# Patient Record
Sex: Male | Born: 1980 | Race: White | Hispanic: No | Marital: Single | Smoking: Current every day smoker
Health system: Southern US, Community
[De-identification: ages and names within clinical notes are randomized; demographics above are authoritative.]

---

## 2014-04-10 ENCOUNTER — Encounter (HOSPITAL_COMMUNITY): Payer: Self-pay | Admitting: Emergency Medicine

## 2014-04-10 ENCOUNTER — Emergency Department (HOSPITAL_COMMUNITY)
Admission: EM | Admit: 2014-04-10 | Discharge: 2014-04-10 | Disposition: A | Discharge status: Home / Self Care | Payer: BLUE CROSS/BLUE SHIELD | Attending: Emergency Medicine | Admitting: Emergency Medicine

## 2014-04-10 ENCOUNTER — Emergency Department (HOSPITAL_COMMUNITY): Payer: BLUE CROSS/BLUE SHIELD

## 2014-04-10 DIAGNOSIS — R109 Unspecified abdominal pain: Secondary | ICD-10-CM | POA: Diagnosis not present

## 2014-04-10 DIAGNOSIS — Z720 Tobacco use: Secondary | ICD-10-CM | POA: Insufficient documentation

## 2014-04-10 DIAGNOSIS — R0602 Shortness of breath: Secondary | ICD-10-CM | POA: Diagnosis not present

## 2014-04-10 DIAGNOSIS — M79602 Pain in left arm: Secondary | ICD-10-CM | POA: Insufficient documentation

## 2014-04-10 DIAGNOSIS — R079 Chest pain, unspecified: Secondary | ICD-10-CM | POA: Diagnosis not present

## 2014-04-10 LAB — CBC
HCT: 43.1 % (ref 39.0–52.0)
Hemoglobin: 14.7 g/dL (ref 13.0–17.0)
MCH: 32.6 pg (ref 26.0–34.0)
MCHC: 34.1 g/dL (ref 30.0–36.0)
MCV: 95.6 fL (ref 78.0–100.0)
PLATELETS: 151 10*3/uL (ref 150–400)
RBC: 4.51 MIL/uL (ref 4.22–5.81)
RDW: 12.4 % (ref 11.5–15.5)
WBC: 7.3 10*3/uL (ref 4.0–10.5)

## 2014-04-10 LAB — URINALYSIS, ROUTINE W REFLEX MICROSCOPIC
Bilirubin Urine: NEGATIVE
GLUCOSE, UA: NEGATIVE mg/dL
Hgb urine dipstick: NEGATIVE
Ketones, ur: NEGATIVE mg/dL
LEUKOCYTES UA: NEGATIVE
NITRITE: NEGATIVE
PH: 6.5 (ref 5.0–8.0)
Protein, ur: NEGATIVE mg/dL
Specific Gravity, Urine: 1.019 (ref 1.005–1.030)
Urobilinogen, UA: 0.2 mg/dL (ref 0.0–1.0)

## 2014-04-10 LAB — BASIC METABOLIC PANEL
ANION GAP: 6 (ref 5–15)
BUN: 16 mg/dL (ref 6–23)
CALCIUM: 10.1 mg/dL (ref 8.4–10.5)
CO2: 29 mmol/L (ref 19–32)
Chloride: 106 mmol/L (ref 96–112)
Creatinine, Ser: 0.91 mg/dL (ref 0.50–1.35)
GLUCOSE: 99 mg/dL (ref 70–99)
Potassium: 4.4 mmol/L (ref 3.5–5.1)
SODIUM: 141 mmol/L (ref 135–145)

## 2014-04-10 LAB — I-STAT TROPONIN, ED: Troponin i, poc: 0 ng/mL (ref 0.00–0.08)

## 2014-04-10 LAB — BRAIN NATRIURETIC PEPTIDE: B Natriuretic Peptide: 30.9 pg/mL (ref 0.0–100.0)

## 2014-04-10 NOTE — ED Notes (Addendum)
Pt reports intermittent left chest sharpness accompanied by left arm numbness, dizziness, SOB, lightheadedness since 2008 or 2009. Pt states had EKG done a while back in [place] but does not remember diagnosis and stated if he was supposed to follow up with someone but he didn't because he doesn't have insurance.

## 2014-04-10 NOTE — ED Provider Notes (Signed)
Complains of anterior chest pain rating to left arm intermittently for 6 months, pleuritic non-exertional. Admits to shortness of breath when he gets the pain. No other associated symptoms. Cardiac risk factors include family history and smoking On exam alert no distress lungs clear auscultation heart regular rate and rhythm no murmurs or rubs chest is tender anteriorly, reproducing pain exactly. Pain is also reproduced with by forcible abduction of left shoulder. Heart score equals 1 Plan outpatient cardiac workup. Patient in agreement  Doug SouSam Rannie Craney, MD 04/10/14 2133

## 2014-04-10 NOTE — Discharge Instructions (Signed)
Chest Pain (Nonspecific) °It is often hard to give a specific diagnosis for the cause of chest pain. There is always a chance that your pain could be related to something serious, such as a heart attack or a blood clot in the lungs. You need to follow up with your health care provider for further evaluation. °CAUSES  °· Heartburn. °· Pneumonia or bronchitis. °· Anxiety or stress. °· Inflammation around your heart (pericarditis) or lung (pleuritis or pleurisy). °· A blood clot in the lung. °· A collapsed lung (pneumothorax). It can develop suddenly on its own (spontaneous pneumothorax) or from trauma to the chest. °· Shingles infection (herpes zoster virus). °The chest wall is composed of bones, muscles, and cartilage. Any of these can be the source of the pain. °· The bones can be bruised by injury. °· The muscles or cartilage can be strained by coughing or overwork. °· The cartilage can be affected by inflammation and become sore (costochondritis). °DIAGNOSIS  °Lab tests or other studies may be needed to find the cause of your pain. Your health care provider may have you take a test called an ambulatory electrocardiogram (ECG). An ECG records your heartbeat patterns over a 24-hour period. You may also have other tests, such as: °· Transthoracic echocardiogram (TTE). During echocardiography, sound waves are used to evaluate how blood flows through your heart. °· Transesophageal echocardiogram (TEE). °· Cardiac monitoring. This allows your health care provider to monitor your heart rate and rhythm in real time. °· Holter monitor. This is a portable device that records your heartbeat and can help diagnose heart arrhythmias. It allows your health care provider to track your heart activity for several days, if needed. °· Stress tests by exercise or by giving medicine that makes the heart beat faster. °TREATMENT  °· Treatment depends on what may be causing your chest pain. Treatment may include: °¨ Acid blockers for  heartburn. °¨ Anti-inflammatory medicine. °¨ Pain medicine for inflammatory conditions. °¨ Antibiotics if an infection is present. °· You may be advised to change lifestyle habits. This includes stopping smoking and avoiding alcohol, caffeine, and chocolate. °· You may be advised to keep your head raised (elevated) when sleeping. This reduces the chance of acid going backward from your stomach into your esophagus. °Most of the time, nonspecific chest pain will improve within 2-3 days with rest and mild pain medicine.  °HOME CARE INSTRUCTIONS  °· If antibiotics were prescribed, take them as directed. Finish them even if you start to feel better. °· For the next few days, avoid physical activities that bring on chest pain. Continue physical activities as directed. °· Do not use any tobacco products, including cigarettes, chewing tobacco, or electronic cigarettes. °· Avoid drinking alcohol. °· Only take medicine as directed by your health care provider. °· Follow your health care provider's suggestions for further testing if your chest pain does not go away. °· Keep any follow-up appointments you made. If you do not go to an appointment, you could develop lasting (chronic) problems with pain. If there is any problem keeping an appointment, call to reschedule. °SEEK MEDICAL CARE IF:  °· Your chest pain does not go away, even after treatment. °· You have a rash with blisters on your chest. °· You have a fever. °SEEK IMMEDIATE MEDICAL CARE IF:  °· You have increased chest pain or pain that spreads to your arm, neck, jaw, back, or abdomen. °· You have shortness of breath. °· You have an increasing cough, or you cough   up blood. °· You have severe back or abdominal pain. °· You feel nauseous or vomit. °· You have severe weakness. °· You faint. °· You have chills. °This is an emergency. Do not wait to see if the pain will go away. Get medical help at once. Call your local emergency services (911 in [place] Do not drive  yourself to the hospital. °MAKE SURE YOU:  °· Understand these instructions. °· Will watch your condition. °· Will get help right away if you are not doing well or get worse. °Document Released: 11/16/2004 Document Revised: 02/11/2013 Document Reviewed: 09/12/2007 °ExitCare® Patient Information ©2015 [employer]. This information is not intended to replace advice given to you by your health care provider. Make sure you discuss any questions you have with your health care provider. ° ° °Emergency Department Resource Guide °1) Find a Doctor and Pay Out of Pocket °Although you won't have to find out who is covered by your insurance plan, it is a good idea to ask around and get recommendations. You will then need to call the office and see if the doctor you have chosen will accept you as a new patient and what types of options they offer for patients who are self-pay. Some doctors offer discounts or will set up payment plans for their patients who do not have insurance, but you will need to ask so you aren't surprised when you get to your appointment. ° °2) Contact Your Local Health Department °Not all health departments have doctors that can see patients for sick visits, but many do, so it is worth a call to see if yours does. If you don't know where your local health department is, you can check in your phone book. The CDC also has a tool to help you locate your state's health department, and many state websites also have listings of all of their local health departments. ° °3) Find a Walk-in Clinic °If your illness is not likely to be very severe or complicated, you may want to try a walk in clinic. These are popping up all over the country in pharmacies, drugstores, and shopping centers. They're usually staffed by nurse practitioners or physician assistants that have been trained to treat common illnesses and complaints. They're usually fairly quick and inexpensive. However, if you have serious medical issues or  chronic medical problems, these are probably not your best option. ° °No Primary Care Doctor: °- Call [facility] at  832-8000 - they can help you locate a primary care doctor that  accepts your insurance, provides certain services, etc. °- Physician Referral Service- 1-800-533-3463 ° °Chronic Pain Problems: °Organization         Address  Phone   Notes  °[facility]  (336) 297-2271 Patients need to be referred by their primary care doctor.  ° °Medication Assistance: °Organization         Address  Phone   Notes  °[place] [facility] [address] °[place], [place] [address] (336) 641-8030 --Must be a resident of [place] °-- Must have NO insurance coverage whatsoever (no Medicaid/ Medicare, etc.) °-- The pt. MUST have a primary care doctor that directs their care regularly and follows them in the community °  °[facility]  (866) 331-1348   °[employer]  (888) 892-1162   ° °Agencies that provide inexpensive medical care: °Organization         Address  Phone   Notes  °[facility]  (  336) 832-8035   °[facility]    (336) 832-7272   °[facility] [address] °[place], [place] [address] (336) 832-4777   °[facility] [address], °[place] (336) 271-4999   °[facility]    (336) 373-0678   °[facility]    (336) 272-1050   °[facility] ° [address], [place] Phone:  (336) 832-4444, Fax:  (336) 832-4440 Hours of Operation:  9 am - 6 pm, M-F.  Also accepts Medicaid/Medicare and self-pay.  °[facility] ° [address], [place] Phone: (336) 832-3150, Fax: (336) 832-3151. Hours of Operation:  8:30 am - 5:30 pm, M-F.  Also accepts Medicaid and self-pay.  °[facility] [place] [address], [place] Phone: (336) 878-6027   °[facility] [address], [place], [place]  (336)723-1848, Ext. 123 Mondays & Thursdays: 7-9 AM.  First 15 patients are seen on a first come, first serve basis. °  ° °Medicaid-accepting [place] Providers: ° °Organization         Address  Phone   Notes  °[facility] [address], [place] (336) 641-2100 Also accepts self-pay patients.  °[facility] [address], [place] ° (336) 856-9996   °[facility] [address], [place] (336) 288-8857   °[facility] [address] [place] [address], [place] (336) 299-7000   °Veita Bland [address], [place]  ° (336) 373-1557 Only accepts [place] Access Medicaid patients after they have their name applied to their card.  ° °Self-Pay (no insurance) in [place]: ° °Organization         Address  Phone   Notes  °Sickle Cell Patients, [facility] [address], [place] (336) 832-1970   °[facility] [address], [place] (336) 832-4400   ° [facility] ° [address], [place] (336) 992-4800   °[facility]/Dr. Osei-Bonsu ° [address] [place] [address], [place] or [address], [place] (336) 841-8500 Phone number for both [place] and [place] locations is the same.  °[facility] [address], [place] (336) 299-0000   °[facility] [address] [place] [address], [place] or [address] (336) 852-7530 °(336) 878-2260   °[facility] [address],  (336) 350-1642, phone; (336) 294-5005, fax Sees patients 1st and 3rd Saturday of every month.  Must not qualify for public or private insurance (i.e. Medicaid, Medicare, [place] Health Choice, Veterans' Benefits) • Household income should be no more than 200% of the poverty level •The clinic cannot treat you if you are pregnant or think you are pregnant • Sexually transmitted  diseases are not treated at the clinic.  ° ° °Dental Care: °Organization         Address  Phone  Notes  °[facility] [address],  (336) 641-6152 Accepts children up to age 21 who are enrolled in Medicaid or [place] Health Choice; pregnant women with a Medicaid card; and children who have applied for Medicaid or [place] Health Choice, but were declined, whose parents can pay a reduced fee at time of service.  °[facility] [place]    [address], [place] (336) 641-7733 Accepts children up to age 21 who are enrolled in Medicaid or [place] Health Choice; pregnant women with a Medicaid card; and children who have applied for Medicaid or [place] Health Choice, but were declined, whose parents can pay a reduced fee at time of service.  °[facility] ° [address], [place] (336) 641-4533 Patients are seen by appointment only. Walk-ins are not accepted. [facility] will see patients 18 years of age and older. °Monday - Tuesday (8am-5pm) °Most Wednesdays (8:30-5pm) °$30 per visit, cash only  °[facility] ° [address], [place] (336) 641-4533 Patients are seen by appointment only. Walk-ins are not accepted. [facility] will see patients 18 years of age and older. °One Wednesday Evening (Monthly: Volunteer Based).  $30 per visit, cash only  °[facility]  (919) 537-3737 for adults; Children under age 4, call Graduate Pediatric Dentistry at (919) [phone]. Children aged 4-14, please call (919) [phone] to request a pediatric application. ° Dental services are provided in all areas of dental care including fillings, crowns and bridges, complete and partial dentures, implants, gum treatment, root canals, and extractions. Preventive care is also provided. Treatment is provided to both adults and children. °Patients are selected via a  lottery and there is often a waiting list. °  °[facility] [address], °[place] ° (336) 763-8833 [email or web address] °  °[facility] [address], [place], [place] (336)723-1848, Ext. 123 Second and Fourth Thursday of each month, opens at 6:30 AM; Clinic ends at 9 AM.  Patients are seen on a first-come first-served basis, and a limited number are seen during each clinic.  ° °[facility] ° [address], [place], [place] (336) 723-7904   Eligibility Requirements °You must have lived in [place], [place], or [place] counties for at least the last three months. °  You cannot be eligible for state or federal sponsored healthcare insurance, including Veterans Administration, Medicaid, or Medicare. °  You generally cannot be eligible for healthcare insurance through your employer.  °  How to apply: °Eligibility screenings are held every Tuesday and Wednesday afternoon from 1:00 pm until 4:00 pm. You do not need an appointment for the interview!  °[facility] [address], [place], [place] 336-631-2330   °[facility]  336-342-8273   °[facility]  336-703-3100   °[facility]  336-570-6415   ° °Behavioral Health Resources in the Community: °Intensive Outpatient Programs °Organization         Address  Phone  Notes  °[facility] [address], [place], [place] 336-878-6098   °[facility] [address], [place], [place] 336-832-9800   °[facility] [address], [place], [place] ° 336-882-2125   °[facility] [address],  °[place], [place] 1-800-853-5163 or 336-641-4981   °Substance Abuse Resources °Organization         Address  Phone  Notes  °[facility]  336-882-2125   °[facility]  336-784-9470   °[facility]  336-285-9073   °[facility]  336-845-3988   °Residential &  Outpatient Substance Abuse Program  1-800-659-3381   °Psychological Services °Organization         Address  Phone  Notes  ° Health  336- 832-9600   °  [facility]  336- 378-7881   °[facility] [address], [place] 1-800-853-5163 or 336-641-4981   ° °Mobile Crisis Teams °Organization         Address  Phone  Notes  °[facility], Mobile Crisis Care Unit  1-877-626-1772   °Assertive °[facility] ° [address] [place], [place] 336-834-9664   °Sharon DeEsch [address] °[place] 336-554-5454   ° °Self-Help/Support Groups °Organization         Address  Phone             Notes  °[facility] - variety of support groups  336- 373-1402 Call for more information  °Narcotics Anonymous (NA), [facility] [address], °[place]  2 meetings at this location  ° °Residential Treatment Programs °Organization         Address  Phone  Notes  °[facility] [address],    °[place]  1-866-801-8205   °[facility] ° [address], [place], [place] 704-293-8524   °[facility] [address], [place] 336-845-3988 Admissions: 8am-3pm M-F  °[facility] [address]    °[place], [place] 336-841-1104   °[facility] [address], [place], [place] 336-379-7146   °[facility] [address]  °[place], [place] 336-285-9073   °[facility] - Intensive Outpatient [address], [place], [place] 336-852-3033   °[facility] [address]  °[place], [place] 1-877-615-2722 or 336-784-9470   °[facility] [address] [place], [place] 336-227-7417 Accepts Medicaid  °[facility] [address]  ° [place] 1-800-659-3381 Substance Abuse/Addiction Treatment  ° °[place] Behavioral Health Resources °Organization          Address  Phone  Notes  °[facility]  (888) 581-9988   °Julie Brannon, PhD [address] [place], [place]   (336) 349-5553 or (336) 951-0000   °[facility]   [address] °[place], [place] (336) 349-4454   °[facility] [address], [place], [place] (336) 342-8316 Insurance/Medicaid/sponsorship through [facility]  °[facility] [address]                                    [place], [place] (336) 342-8316 Therapy/tele-psych/case  °[facility] [address]  ° [place], [place] (336) 349-2233    °Dr. Arfeen  (336) 349-4544   °[facility] [place]  [employer] [place] [facility] 1) [address],  °2) [address], [place] °3)  [address], [place] (336) 349-3220 °(336) 342-7768 ° °(336) 342-8140   °[place] [facility] (336) 342-1394 or (336) 342-3537 (After Hours)    ° ° ° °

## 2014-04-10 NOTE — ED Provider Notes (Signed)
CSN: [id number]     Arrival date & time 04/10/14  1709 History   First MD Initiated Contact with Patient 04/10/14 1838     Chief Complaint  Patient presents with  . Arm Pain  . Chest Pain  . Flank Pain   HPI  Patient is a 34 year old who presents emergency room for evaluation of chest pain with occasional left arm tingling, and flank pain. Patient states that he has been intermittently experiencing chest pain for the last 6 months to a year. He states that the pain lasts sometimes for 15-20 minutes and sometimes can last up to 2 hours. It comes at rest and also with activity. It is associated occasionally with left arm tingling and numbness and sometimes with shortness of breath. Patient states that he has had no history of heart problems in the past. He does admit that his father had heart attacks and heart problems which started at approximately this age. He also believes that his paternal grandfather had a heart history. Patient is a pack per day smoker. He smokes every day. He does not see doctors. He is not aware of a history of hypertension, diabetes, or high cholesterol. He does do heavy lifting for his job. Patient also complains of some intermittent flank pain. He states that sometimes is associated with dark concentrated urine. He denies ear nares frequency, urgency, or dysuria.  History reviewed. No pertinent past medical history. History reviewed. No pertinent past surgical history. History reviewed. No pertinent family history. History  Substance Use Topics  . Smoking status: Current Every Day Smoker  . Smokeless tobacco: Not on file  . Alcohol Use: Yes    Review of Systems  Constitutional: Negative for fever, chills and fatigue.  Respiratory: Negative for chest tightness and shortness of breath.   Cardiovascular: Positive for chest pain. Negative for palpitations and leg swelling.  Gastrointestinal: Negative for nausea, vomiting, abdominal pain, diarrhea and constipation.   Genitourinary: Negative for urgency, hematuria, flank pain, enuresis and difficulty urinating.  All other systems reviewed and are negative.     Allergies  Epinephrine  Home Medications   Prior to Admission medications   Not on File   BP 130/76 mmHg  Pulse 69  Temp(Src) 98 F (36.7 C) (Oral)  Resp 17  SpO2 97% Physical Exam  Constitutional: He is oriented to person, place, and time. He appears well-developed and well-nourished. No distress.  HENT:  Head: Normocephalic and atraumatic.  Mouth/Throat: No oropharyngeal exudate.  Eyes: Conjunctivae and EOM are normal. Pupils are equal, round, and reactive to light. No scleral icterus.  Neck: Normal range of motion. Neck supple. No JVD present. No thyromegaly present.  Cardiovascular: Normal rate, regular rhythm, normal heart sounds and intact distal pulses.  Exam reveals no gallop and no friction rub.   No murmur heard. Pulmonary/Chest: Effort normal and breath sounds normal. No respiratory distress. He has no wheezes. He has no rales. He exhibits tenderness.  Abdominal: Soft. Bowel sounds are normal. He exhibits no distension and no mass. There is no tenderness. There is no rebound and no guarding.  Musculoskeletal: Normal range of motion.  Lymphadenopathy:    He has no cervical adenopathy.  Neurological: He is alert and oriented to person, place, and time.  Skin: Skin is warm and dry. He is not diaphoretic.  Psychiatric: He has a normal mood and affect. His behavior is normal. Judgment and thought content normal.  Nursing note and vitals reviewed.   ED Course  Procedures (including  critical care time) Labs Review Labs Reviewed  URINALYSIS, ROUTINE W REFLEX MICROSCOPIC - Abnormal; Notable for the following:    APPearance CLOUDY (*)    All other components within normal limits  BRAIN NATRIURETIC PEPTIDE  BASIC METABOLIC PANEL  CBC  I-STAT TROPOININ, ED    Imaging Review Dg Chest 2 View (if Patient Has Fever And/or  Copd)  04/10/2014   CLINICAL DATA:  Intermittent LEFT arm numbness for past few months affecting arm and hand, intermittent LEFT chest pain with associated shortness of breath, LEFT flank pain for a few months, malodorous urine, smoker  EXAM: CHEST  2 VIEW  COMPARISON:  02/05/2012  FINDINGS: Normal heart size, mediastinal contours, and pulmonary vascularity.  Lungs clear.  No pleural effusion or pneumothorax.  Bones unremarkable.  IMPRESSION: No acute abnormalities.   Electronically Signed   By: Ulyses Southward M.D.   On: 04/10/2014 19:42     EKG Interpretation   Date/Time:  Friday April 10 2014 17:53:31 EST Ventricular Rate:  86 PR Interval:  163 QRS Duration: 85 QT Interval:  338 QTC Calculation: 404 R Axis:   86 Text Interpretation:  Sinus rhythm ST elev, probable normal early repol  pattern Baseline wander in lead(s) V3 No old tracing to compare Confirmed  by Ethelda Chick  MD, SAM [phone]) on 04/10/2014 6:09:53 PM      MDM   Final diagnoses:  Chest pain, unspecified chest pain type   Patient's 34 year old male who presents emergency room for evaluation of chest pain. Patient is not actively having chest pain at this time. He also complains of intermittent flank pain. Physical exam reveals some tenderness palpation of the chest. Patient does not have abdominal discomfort at this time. EKG reveals sinus rhythm with possible early re-pole with no old tracing to compare to. Chest x-ray is negative. I-STAT troponin is 0. UA is negative. BMP is unremarkable. BNP is negative. CBC is unremarkable. Patient has a heart score of 2-3. I believe that the patient is low risk at this time for PE. He is PERC negative. I suspect that this may be partially chest wall pain. Patient should have outpatient follow-up with cardiology. I believe that he is stable for discharge at this time. Patient seen by and discussed with Dr. Rennis Chris who agrees the above workup and plan. I will provide patient with a resource  list and also a referral to the [facility] cardiology group to schedule an outpatient stress test. Patient's stable for discharge at this time.   Eben Burow, PA-C 04/10/14 2123  Doug Sou, MD 04/11/14 6045

## 2014-04-10 NOTE — ED Notes (Signed)
Pt reports intermittent L arm numbness for the past few months. When episode happens he cannot feel arm and especially hand. Able to move fingers with no difficulty when it happens. Pt also reports intermittent chest pain on L side of chest accompanied by SOB when it is really bad. Pt states he has some chest pain now, but not too bad. Pt also reports L flank pain for the past few months, wife states urine smells malodorous.

## 2015-10-09 IMAGING — CR DG CHEST 2V
2 series · 2 of 2 positions shown · non-contrast
Comparison: 02/05/2012

CLINICAL DATA: Intermittent LEFT arm numbness for past few months
affecting arm and hand, intermittent LEFT chest pain with associated
shortness of breath, LEFT flank pain for a few months, malodorous
urine, smoker

EXAM:
CHEST  2 VIEW

[w chest pa]
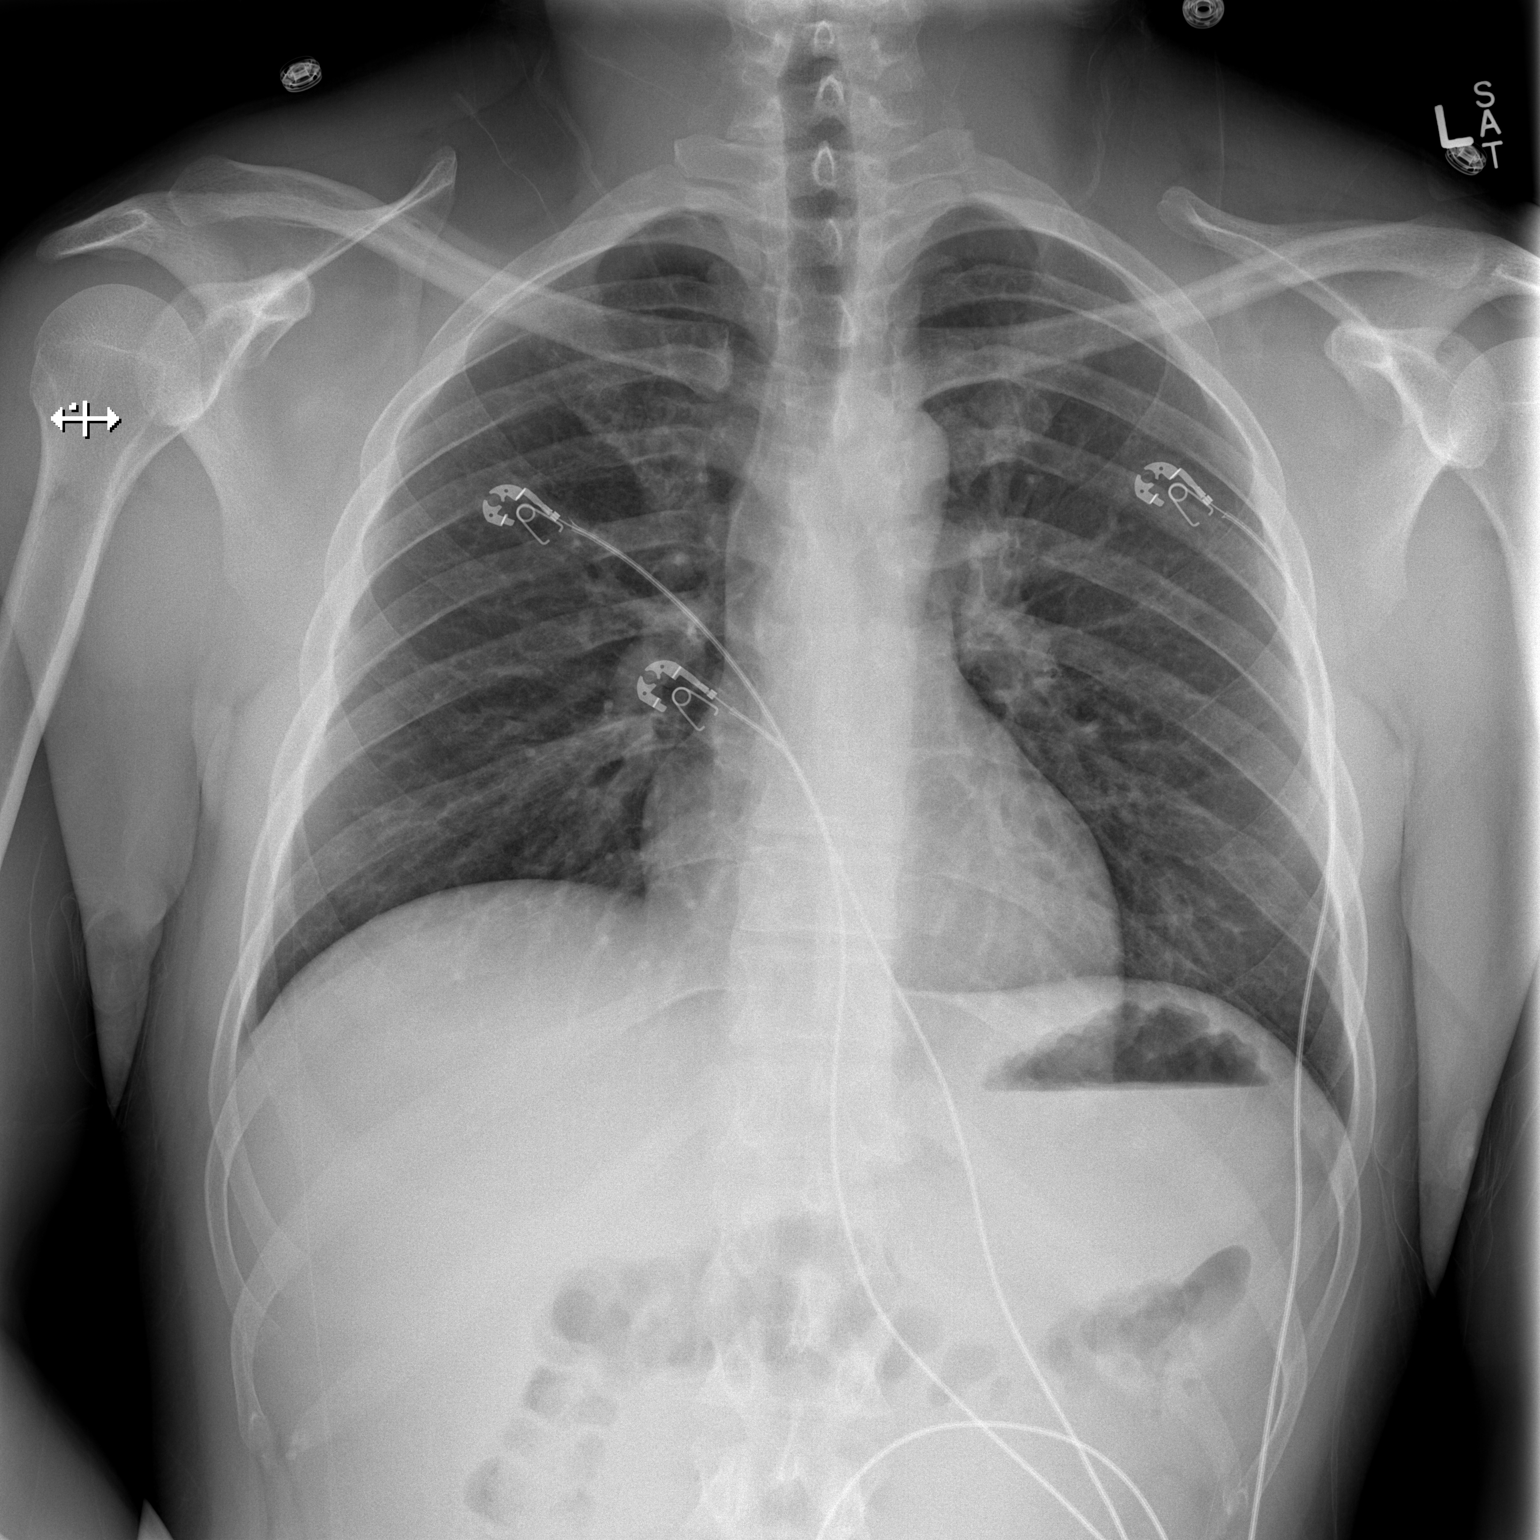

[w chest lat]
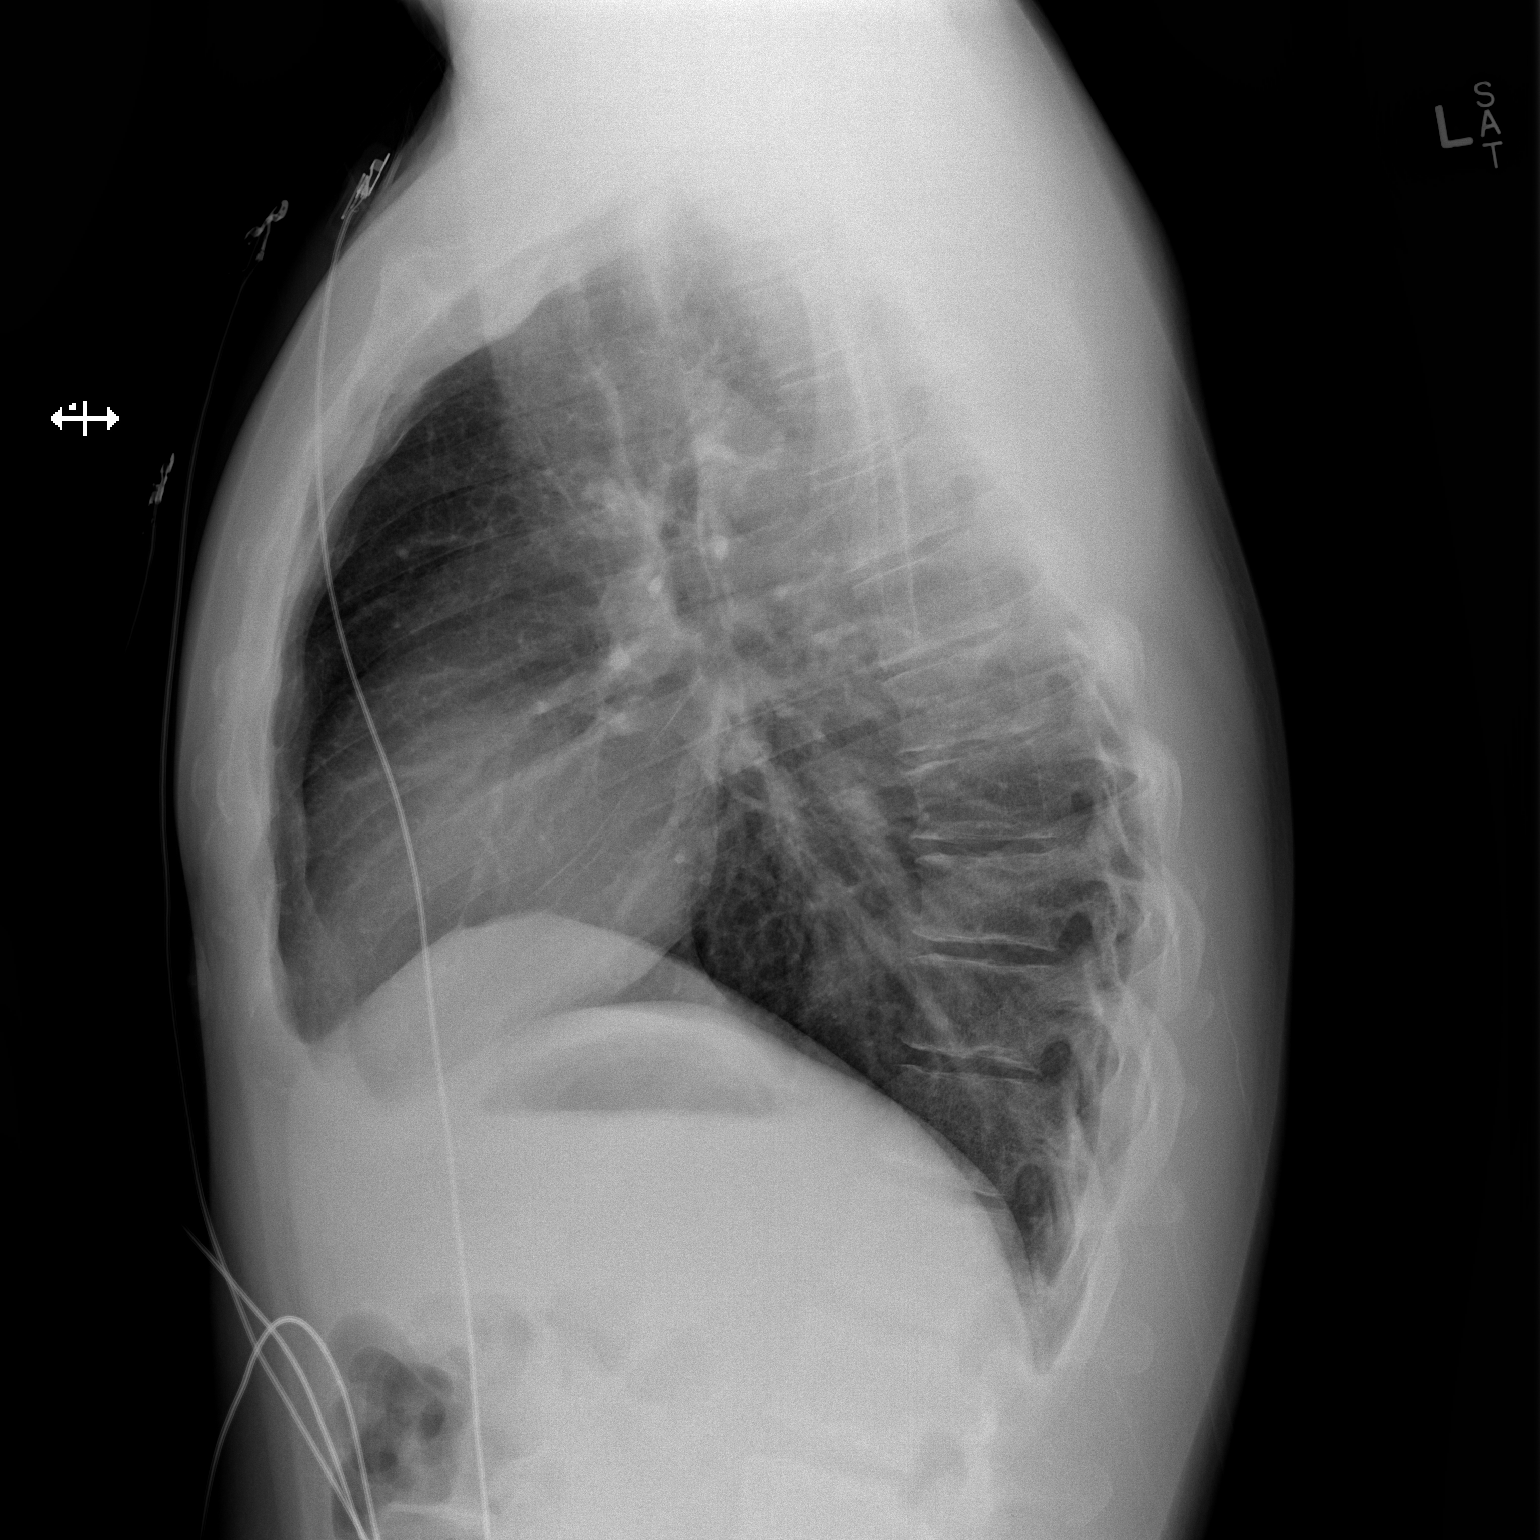

[2 of 2 positions shown; findings below may reference images not displayed]

FINDINGS: Normal heart size, mediastinal contours, and pulmonary vascularity.

Lungs clear.

No pleural effusion or pneumothorax.

Bones unremarkable.
IMPRESSION: No acute abnormalities.
